# Patient Record
Sex: Female | Born: 1980 | Hispanic: No | Marital: Married | Smoking: Never smoker
Health system: Southern US, Community
[De-identification: ages and names within clinical notes are randomized; demographics above are authoritative.]

## PROBLEM LIST (undated history)

## (undated) ENCOUNTER — Inpatient Hospital Stay (HOSPITAL_COMMUNITY): Payer: Self-pay

---

## 2013-02-20 LAB — OB RESULTS CONSOLE HIV ANTIBODY (ROUTINE TESTING): HIV: NONREACTIVE

## 2013-02-28 LAB — OB RESULTS CONSOLE ABO/RH: RH TYPE: POSITIVE

## 2013-02-28 LAB — OB RESULTS CONSOLE ANTIBODY SCREEN: ANTIBODY SCREEN: NEGATIVE

## 2013-05-24 NOTE — L&D Delivery Note (Signed)
Delivery Note At 7:01 AM a viable female was delivered via Vaginal, Spontaneous Delivery (Presentation: ; Occiput Anterior).  APGAR: 9, 9; weight pending.   Placenta status: Intact, Spontaneous.  Cord: 3 vessels with the following complications: None.  Cord pH: not collected.  Anesthesia: None  Episiotomy: None Lacerations: 1st degree Suture Repair: 3.0 vicryl Est. Blood Loss (mL): 400  Mom to postpartum.  Baby to Couplet care / Skin to Skin.  Routine PP orders Breastfeeding   Aqueelah Cotrell 08/28/2013, 7:48 AM

## 2013-06-25 LAB — OB RESULTS CONSOLE GBS: STREP GROUP B AG: NEGATIVE

## 2013-07-18 ENCOUNTER — Encounter (HOSPITAL_COMMUNITY): Payer: Self-pay | Admitting: *Deleted

## 2013-07-18 ENCOUNTER — Observation Stay (HOSPITAL_COMMUNITY): Payer: BC Managed Care – PPO

## 2013-07-18 ENCOUNTER — Observation Stay (HOSPITAL_COMMUNITY)
Admission: AD | Admit: 2013-07-18 | Discharge: 2013-07-19 | Disposition: A | Discharge status: Home / Self Care | Payer: BC Managed Care – PPO | Source: Ambulatory Visit | Attending: Obstetrics and Gynecology | Admitting: Obstetrics and Gynecology

## 2013-07-18 DIAGNOSIS — O4690 Antepartum hemorrhage, unspecified, unspecified trimester: Principal | ICD-10-CM | POA: Insufficient documentation

## 2013-07-18 DIAGNOSIS — O4693 Antepartum hemorrhage, unspecified, third trimester: Secondary | ICD-10-CM

## 2013-07-18 DIAGNOSIS — O4700 False labor before 37 completed weeks of gestation, unspecified trimester: Secondary | ICD-10-CM | POA: Diagnosis present

## 2013-07-18 DIAGNOSIS — O99019 Anemia complicating pregnancy, unspecified trimester: Secondary | ICD-10-CM | POA: Insufficient documentation

## 2013-07-18 DIAGNOSIS — D649 Anemia, unspecified: Secondary | ICD-10-CM | POA: Insufficient documentation

## 2013-07-18 LAB — CBC
HCT: 29 % — ABNORMAL LOW (ref 36.0–46.0)
Hemoglobin: 9.9 g/dL — ABNORMAL LOW (ref 12.0–15.0)
MCH: 30.6 pg (ref 26.0–34.0)
MCHC: 34.1 g/dL (ref 30.0–36.0)
MCV: 89.5 fL (ref 78.0–100.0)
PLATELETS: 221 10*3/uL (ref 150–400)
RBC: 3.24 MIL/uL — ABNORMAL LOW (ref 3.87–5.11)
RDW: 12.9 % (ref 11.5–15.5)
WBC: 11.3 10*3/uL — ABNORMAL HIGH (ref 4.0–10.5)

## 2013-07-18 LAB — OB RESULTS CONSOLE GBS
GBS: NEGATIVE
STREP GROUP B AG: NEGATIVE
STREP GROUP B AG: NEGATIVE

## 2013-07-18 LAB — AMNISURE RUPTURE OF MEMBRANE (ROM) NOT AT ARMC: AMNISURE: NEGATIVE

## 2013-07-18 IMAGING — US US OB COMP +14 WK
1 series · 12 of 28 positions shown · non-contrast
Comparison: none

[Series 1: us ob comp +14 wk · 53 acquisitions, 12 frames shown]
[im 2/53]
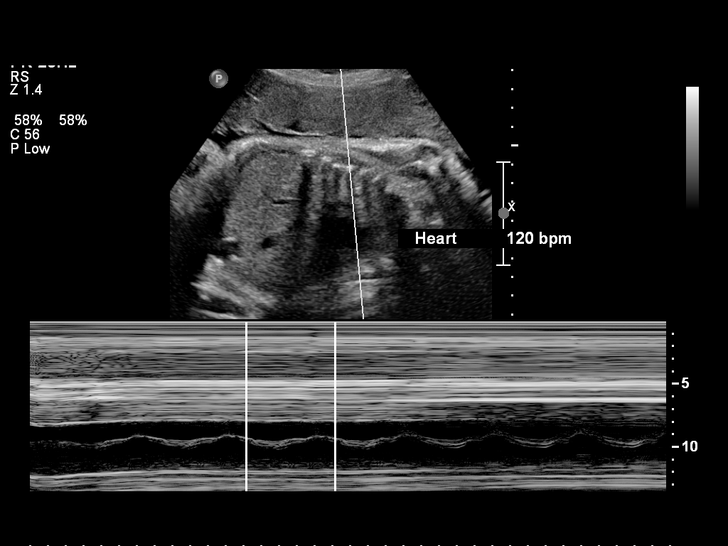
[im 6/53]
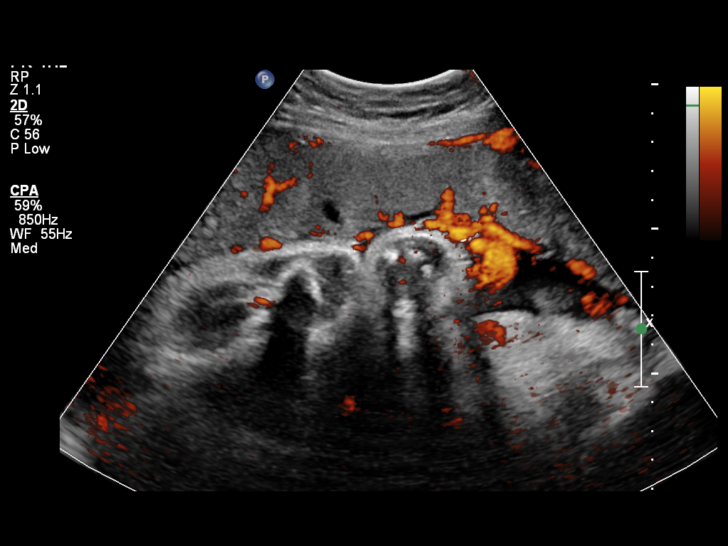
[im 10/53]
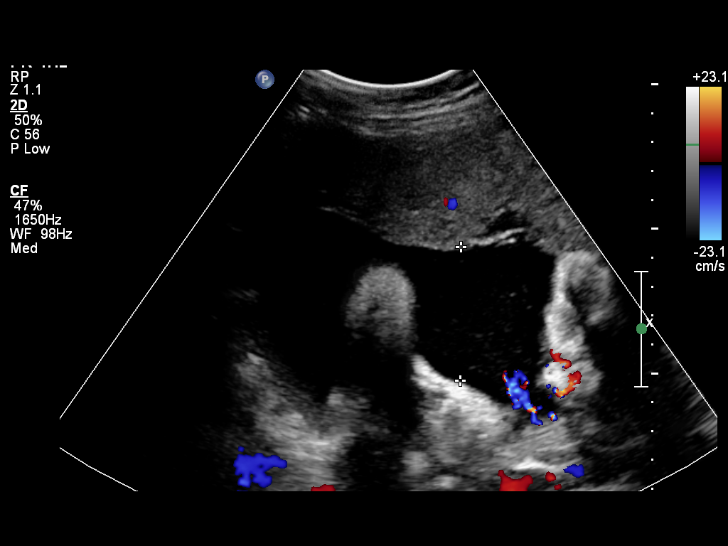
[im 16/53]
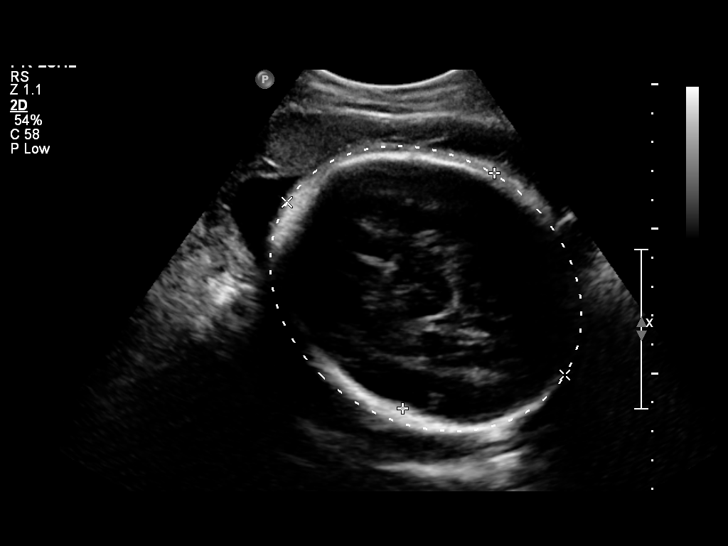
[im 20/53]
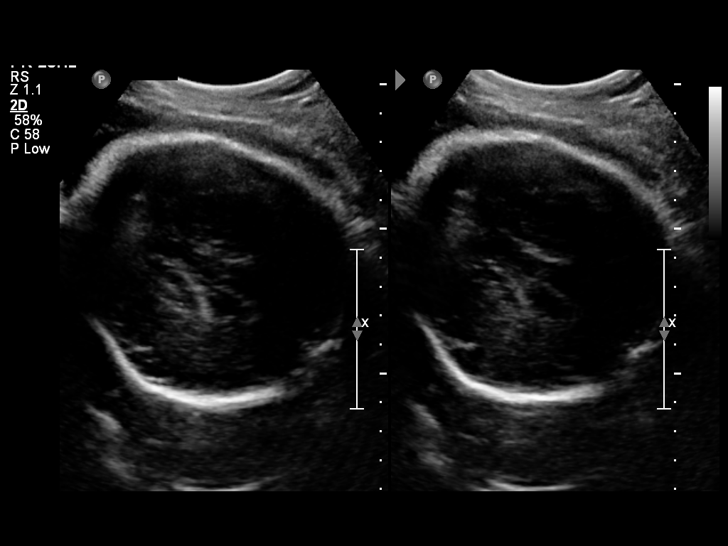
[im 24/53]
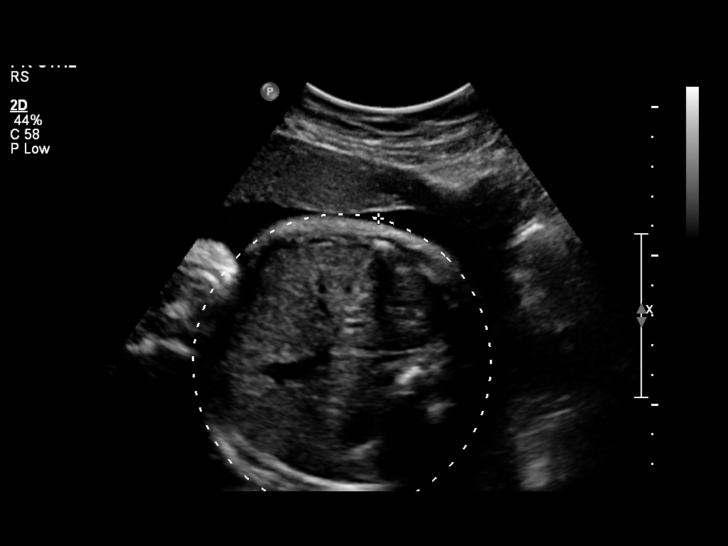
[im 29/53]
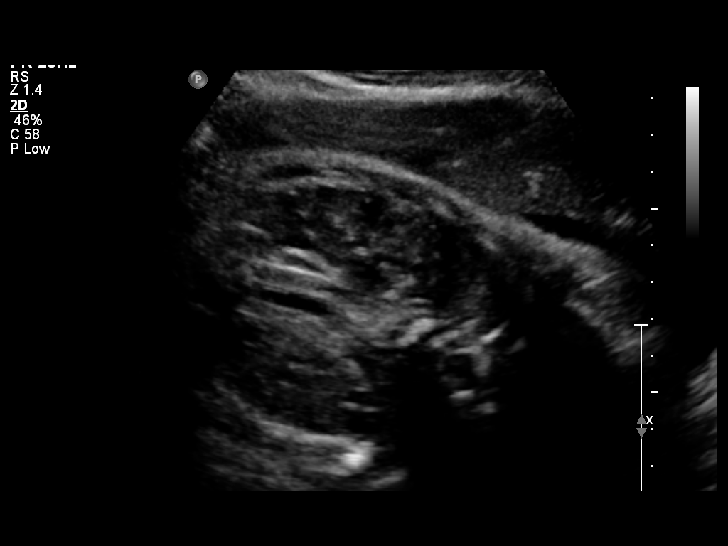
[im 33/53]
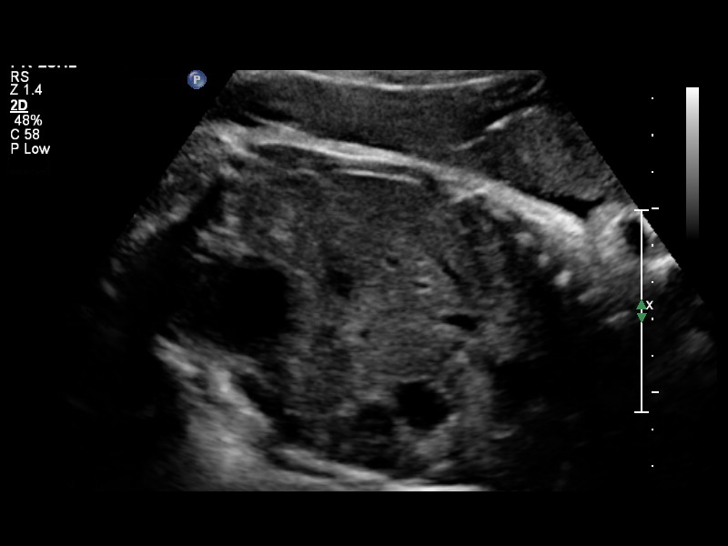
[im 37/53]
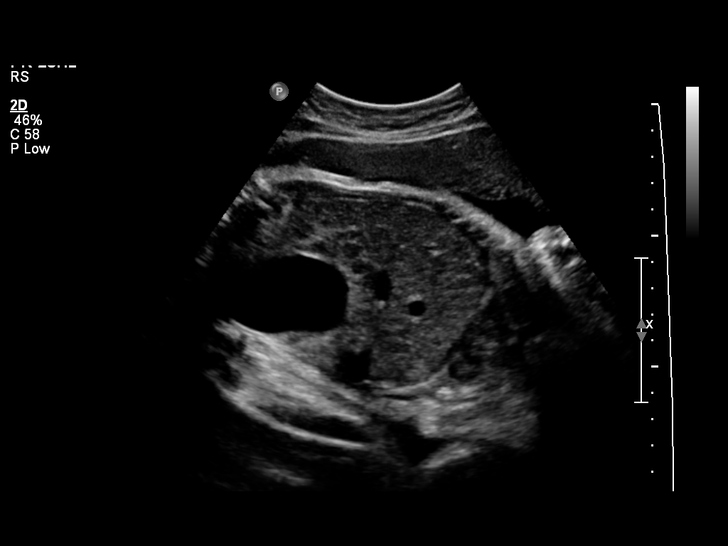
[im 43/53]
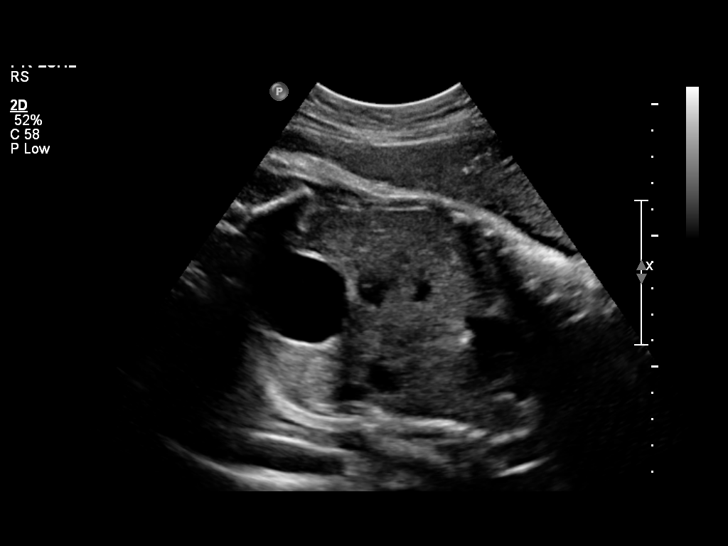
[im 47/53]
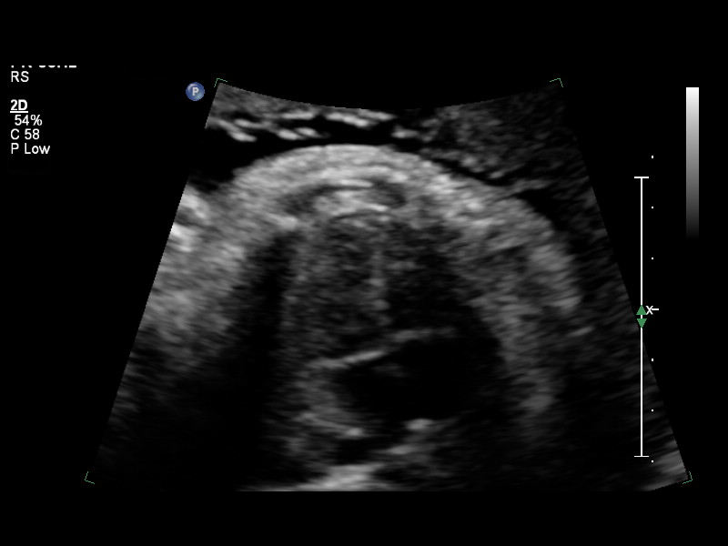
[im 51/53]
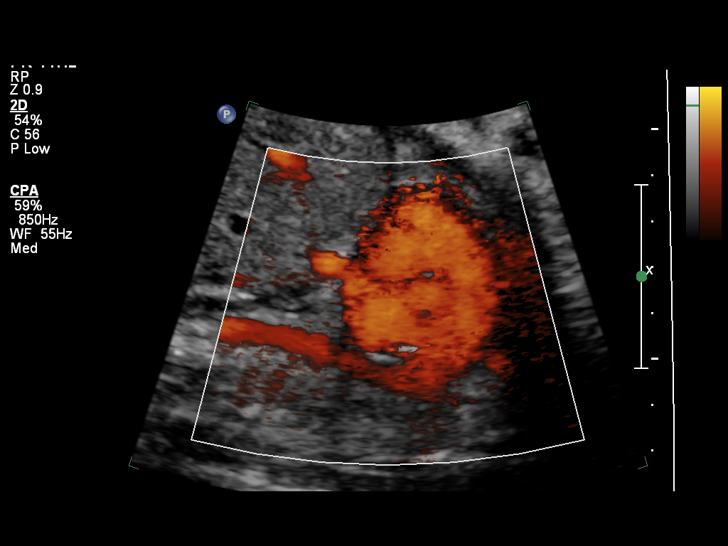

[12 of 28 positions shown; findings below may reference images not displayed]

OBSTETRICS REPORT
                      (Signed Final [DATE] [DATE])

Service(s) Provided

 US OB COMP + 14 WK                                    76805.1
Indications

 Vaginal bleeding, unknown etiology
 Preterm labor (> 22 weeks)
Fetal Evaluation

 Num Of Fetuses:    1
 Fetal Heart Rate:  120                          bpm
 Cardiac Activity:  Observed
 Presentation:      Cephalic
 Placenta:          Anterior, above cervical os
 P. Cord            Visualized, central
 Insertion:

 Amniotic Fluid
 AFI FV:      Subjectively within normal limits
 AFI Sum:     14.73   cm       53  %Tile     Larg Pckt:    4.61  cm
 RUQ:   4.61    cm   RLQ:    4.13   cm    LUQ:   3.17    cm   LLQ:    2.82   cm
Biometry

 BPD:       88  mm     G. Age:  35w 4d                CI:        73.59   70 - 86
                                                      FL/HC:      20.4   20.1 -

 HC:     325.9  mm     G. Age:  37w 0d       68  %    HC/AC:      1.05   0.93 -

 AC:     309.2  mm     G. Age:  34w 6d       56  %    FL/BPD:     75.6   71 - 87
 FL:      66.5  mm     G. Age:  34w 2d       26  %    FL/AC:      21.5   20 - 24

 Est. FW:    [MY]  gm    5 lb 10 oz      64  %
Gestational Age

 U/S Today:     35w 3d                                        EDD:   [DATE]
 Best:          34w 6d     Det. By:  Early Ultrasound         EDD:   [DATE]
Anatomy

 Cranium:          Appears normal         Aortic Arch:      Not well visualized
 Fetal Cavum:      Appears normal         Ductal Arch:      Appears normal
 Ventricles:       Appears normal         Diaphragm:        Appears normal
 Choroid Plexus:   Appears normal         Stomach:          Appears normal, left
                                                            sided
 Cerebellum:       Not well visualized    Abdomen:          Appears normal
 Posterior Fossa:  Not well visualized    Abdominal Wall:   Not well visualized
 Nuchal Fold:      Not applicable (>20    Cord Vessels:     Appears normal (3
                   wks GA)                                  vessel cord)
 Face:             Orbits appear          Kidneys:          Appear normal
                   normal
 Lips:             Appears normal         Bladder:          Appears normal
 Heart:            Appears normal         Spine:            Not well visualized
                   (4CH, axis, and
                   situs)
 RVOT:             Appears normal         Lower             Appears normal
                                          Extremities:
 LVOT:             Appears normal         Upper             Appears normal
                                          Extremities:

 Other:  Fetus appears to be a female. Nasal bone visualized. Technically
         difficult due to fetal position.
Cervix Uterus Adnexa

 Cervix:       Not visualized (advanced GA >[MY])
Impression

 IUP at 34+6 weeks
 Normal detailed fetal anatomy; limited views of profle,
 posterior fossa, AA, CI and spine
 Normal amniotic fluid volume
 Measurements consistent with early US; EFW at the 64th
 %tile
 Anterior placenta without evidence of abruption; no previa
Recommendations

 Follow-up as clinically indicated

 questions or concerns.

## 2013-07-18 MED ORDER — ACETAMINOPHEN 325 MG PO TABS: 650.0000 mg | ORAL_TABLET | ORAL | Status: DC | PRN

## 2013-07-18 MED ORDER — CALCIUM CARBONATE ANTACID 500 MG PO CHEW: 2.0000 | CHEWABLE_TABLET | ORAL | Status: DC | PRN

## 2013-07-18 MED ORDER — PRENATAL MULTIVITAMIN CH
1.0000 | ORAL_TABLET | Freq: Every day | ORAL | Status: DC
  Administered 2013-07-19: 1 via ORAL
  Filled 2013-07-18: qty 1

## 2013-07-18 MED ORDER — ZOLPIDEM TARTRATE 5 MG PO TABS: 5.0000 mg | ORAL_TABLET | Freq: Every evening | ORAL | Status: DC | PRN

## 2013-07-18 MED ORDER — ONDANSETRON HCL 4 MG/2ML IJ SOLN
4.0000 mg | Freq: Once | INTRAMUSCULAR | Status: AC
  Administered 2013-07-18: 4 mg via INTRAVENOUS
  Filled 2013-07-18: qty 2

## 2013-07-18 MED ORDER — NIFEDIPINE 10 MG PO CAPS
10.0000 mg | ORAL_CAPSULE | ORAL | Status: DC | PRN
  Administered 2013-07-18 – 2013-07-19 (×2): 10 mg via ORAL
  Filled 2013-07-18 (×2): qty 1

## 2013-07-18 MED ORDER — DOCUSATE SODIUM 100 MG PO CAPS
100.0000 mg | ORAL_CAPSULE | Freq: Every day | ORAL | Status: DC
  Administered 2013-07-19: 100 mg via ORAL
  Filled 2013-07-18: qty 1

## 2013-07-18 MED ORDER — DEXTROSE IN LACTATED RINGERS 5 % IV SOLN
INTRAVENOUS | Status: DC
  Administered 2013-07-18 – 2013-07-19 (×2): via INTRAVENOUS

## 2013-07-18 NOTE — Progress Notes (Signed)
Dr Stefano GaulStringer notified pt admitted to room 154.  MD will come to evaluate pt

## 2013-07-18 NOTE — H&P (Signed)
Admission History and Physical Exam for an Obstetrics Patient  [name] is a 33 y.o. female, G3P1011, at [redacted]w[redacted]d gestation, who presents for evaluation of third trimester bleeding and uterine contractions. She has been followed at the [facility] and Gynecology division of [facility].  Her pregnancy has been complicated by anemia. The patient presented to the office today complaining of bleeding and contractions. She was evaluated and was found to be 3 cm dilated. Her cervix is said to be friable. See history below.  OB History   Grav Para Term Preterm Abortions TAB SAB Ect Mult Living   3 1 1  1 1    1       History reviewed. No pertinent past medical history.  Prescriptions prior to admission  Medication Sig Dispense Refill  . Prenatal Vit-Fe Fumarate-FA (PRENATAL MULTIVITAMIN) TABS tablet Take 1 tablet by mouth daily at 12 noon.        History reviewed. No pertinent past surgical history.  Allergies  Allergen Reactions  . Codeine Nausea And Vomiting    Family History: family history is not on file.  Social History:  reports that she has never smoked. She has never used smokeless tobacco. She reports that she does not drink alcohol or use illicit drugs.  Review of systems: Normal pregnancy complaints.  Admission Physical Exam:    Body mass index is 28.82 kg/(m^2).  Blood pressure 118/67, pulse 84, temperature 97.9 F (36.6 C), temperature source Oral, resp. rate 18, height 5\' 4"  (1.626 m), weight 168 lb (76.204 kg), last menstrual period 11/28/2012.  HEENT:                 Within normal limits Chest:                   Clear Heart:                    Regular rate and rhythm Abdomen:             Gravid and nontender Extremities:          Grossly normal Neurologic exam: Grossly normal Pelvic exam:         Cervix: 3 cm. Minimal spotting noted at this time  Prenatal labs: ABO, Rh:             O/Positive/-- (10/08 0000) HBsAg:                    nonreactive HIV:                       Non-reactive (09/30 0000)  GBS:                       pending Antibody:              Negative (10/08 0000) Rubella:                  immune RPR:                      nonreactive     Prenatal Transfer Tool  Maternal Diabetes: No Genetic Screening: Declined Maternal Ultrasounds/Referrals: Normal Fetal Ultrasounds or other Referrals:  None Maternal Substance Abuse:  No Significant Maternal Medications:  None Significant Maternal Lab Results:  None Other Comments:  Gonorrhea, Chlamydia, and beta strep cultures are pending  Results for orders placed during the hospital encounter  of 07/18/13 (from the past 24 hour(s))  AMNISURE RUPTURE OF MEMBRANE (ROM)     Status: None   Collection Time    07/18/13  6:40 PM      Result Value Ref Range   Amnisure ROM NEGATIVE    CBC     Status: Abnormal   Collection Time    07/18/13  8:03 PM      Result Value Ref Range   WBC 11.3 (*) 4.0 - 10.5 K/uL   RBC 3.24 (*) 3.87 - 5.11 MIL/uL   Hemoglobin 9.9 (*) 12.0 - 15.0 g/dL   HCT 16.129.0 (*) 09.636.0 - 04.546.0 %   MCV 89.5  78.0 - 100.0 fL   MCH 30.6  26.0 - 34.0 pg   MCHC 34.1  30.0 - 36.0 g/dL   RDW 40.912.9  81.111.5 - 91.415.5 %   Platelets 221  150 - 400 K/uL   Fetal heart tones: Category 1 Contractions: Mild and every 3-4 minutes  Assessment:  5674w6d gestation  Third trimester bleeding  Uterine contractions  Anemia  Rule out labor  Plan:  An ultrasound has been performed and the results are pending.  We will give the patient a fluid bolus and use Procardia as needed. We do not believe that betamethasone is justified.  The patient will be observed at least overnight.  Repeat CBC in the morning.   Katherine Chandler,Katherine Chandler 07/18/2013, 8:24 PM

## 2013-07-18 NOTE — Progress Notes (Signed)
Pt to US per w/c

## 2013-07-19 DIAGNOSIS — O4693 Antepartum hemorrhage, unspecified, third trimester: Secondary | ICD-10-CM

## 2013-07-19 LAB — CBC
HCT: 26.9 % — ABNORMAL LOW (ref 36.0–46.0)
Hemoglobin: 9 g/dL — ABNORMAL LOW (ref 12.0–15.0)
MCH: 30.7 pg (ref 26.0–34.0)
MCHC: 33.5 g/dL (ref 30.0–36.0)
MCV: 91.8 fL (ref 78.0–100.0)
Platelets: 239 10*3/uL (ref 150–400)
RBC: 2.93 MIL/uL — AB (ref 3.87–5.11)
RDW: 12.8 % (ref 11.5–15.5)
WBC: 8.5 10*3/uL (ref 4.0–10.5)

## 2013-07-19 NOTE — Progress Notes (Signed)
Pt. Is stable and ready to be discharged home. All belongings are with the patient. All discharge instructions are given and prescriptions reviewed. Pt. Wheeled out via wheelchair to husband.

## 2013-07-19 NOTE — Discharge Instructions (Signed)
Preterm Labor Information °Preterm labor is when labor starts before you are [redacted] weeks pregnant. The normal length of pregnancy is 39 to 41 weeks.  °CAUSES  °The cause of preterm labor is not often known. The most common known cause is infection. °RISK FACTORS °· Having a history of preterm labor. °· Having your water break before it should. °· Having a placenta that covers the opening of the cervix. °· Having a placenta that breaks away from the uterus. °· Having a cervix that is too weak to hold the baby in the uterus. °· Having too much fluid in the amniotic sac. °· Taking drugs or smoking while pregnant. °· Not gaining enough weight while pregnant. °· Being younger than 18 and older than 31 years old. °· Having a low income. °· Being African American. °SYMPTOMS °· Period-like cramps, belly (abdominal) pain, or back pain. °· Contractions that are regular, as often as six in an hour. They may be mild or painful. °· Contractions that start at the top of the belly. They then move to the lower belly and back. °· Lower belly pressure that seems to get stronger. °· Bleeding from the vagina. °· Fluid leaking from the vagina. °TREATMENT  °Treatment depends on: °· Your condition. °· The condition of your baby. °· How many weeks pregnant you are. °Your doctor may have you: °· Take medicine to stop contractions. °· Stay in bed except to use the restroom (bed rest). °· Stay in the hospital. °WHAT SHOULD YOU DO IF YOU THINK YOU ARE IN PRETERM LABOR? °Call your doctor right away. You need to go to the hospital right away.  °HOW CAN YOU PREVENT PRETERM LABOR IN FUTURE PREGNANCIES? °· Stop smoking, if you smoke. °· Maintain healthy weight gain. °· Do not take drugs or be around chemicals that are not needed. °· Tell your doctor if you think you have an infection. °· Tell your doctor if you had a preterm labor before. °Document Released: 08/06/2008 Document Revised: 02/28/2013 Document Reviewed: 08/06/2008 °ExitCare® Patient  Information ©2014 [employer]. ° °

## 2013-07-19 NOTE — Discharge Summary (Signed)
Physician Discharge Summary  Patient ID: Katherine ChimesStacy Chandler MRN: [id number] DOB/AGE: 1980/10/19 33 y.o.  Admit date: 07/18/2013 Discharge date: 07/19/2013  Admission Diagnoses: Preterm contractions with vaginal bleeding and advanced cervical dilation  Discharge Diagnoses: same the vaginal bleeding has resolved Active Problems:   Premature uterine contractions, antepartum   Third trimester bleeding   Discharged Condition: good  Hospital Course: Pt was admitted for observation secondary to thired trimester bleeding with contractions and dilation to 3 cm.  She had a normal US with normal growth.  She has stopped contracting and no longer has VB  Consults: None  Significant Diagnostic Studies: labs:  Recent Results (from the past 2160 hour(s))  AMNISURE RUPTURE OF MEMBRANE (ROM)     Status: None   Collection Time    07/18/13  6:40 PM      Result Value Ref Range   Amnisure ROM NEGATIVE    CBC     Status: Abnormal   Collection Time    07/18/13  8:03 PM      Result Value Ref Range   WBC 11.3 (*) 4.0 - 10.5 K/uL   RBC 3.24 (*) 3.87 - 5.11 MIL/uL   Hemoglobin 9.9 (*) 12.0 - 15.0 g/dL   HCT 62.129.0 (*) 30.836.0 - 65.746.0 %   MCV 89.5  78.0 - 100.0 fL   MCH 30.6  26.0 - 34.0 pg   MCHC 34.1  30.0 - 36.0 g/dL   RDW 84.612.9  96.211.5 - 95.215.5 %   Platelets 221  150 - 400 K/uL  CBC     Status: Abnormal   Collection Time    07/19/13  5:29 AM      Result Value Ref Range   WBC 8.5  4.0 - 10.5 K/uL   RBC 2.93 (*) 3.87 - 5.11 MIL/uL   Hemoglobin 9.0 (*) 12.0 - 15.0 g/dL   HCT 84.126.9 (*) 32.436.0 - 40.146.0 %   MCV 91.8  78.0 - 100.0 fL   MCH 30.7  26.0 - 34.0 pg   MCHC 33.5  30.0 - 36.0 g/dL   RDW 02.712.8  25.311.5 - 66.415.5 %   Platelets 239  150 - 400 K/uL     Treatments: IV hydration  Discharge Exam: Blood pressure 105/55, pulse 86, temperature 98.6 F (37 C), temperature source Oral, resp. rate 16, height 5\' 4"  (1.626 m), weight 168 lb (76.204 kg), last menstrual period 11/28/2012. General appearance: alert and  cooperative Back: no tenderness to percussion or palpation Resp: clear to auscultation bilaterally Cardio: regular rate and rhythm, S1, S2 normal, no murmur, click, rub or gallop Extremities: extremities normal, atraumatic, no cyanosis or edema BP 106/61  Pulse 79  Temp(Src) 98.5 F (36.9 C) (Oral)  Resp 18  Ht 5\' 4"  (1.626 m)  Wt 168 lb (76.204 kg)  BMI 28.82 kg/m2  LMP 11/28/2012 Cat 1 NST with occ contraction  Disposition: Final discharge disposition not confirmed  Discharge Orders   Future Orders Complete By Expires   Discharge patient  As directed        Medication List         prenatal multivitamin Tabs tablet  Take 1 tablet by mouth daily at 12 noon.           Follow-up Information   [facility]. (keep current appointment)    Specialty:  Obstetrics and Gynecology   Contact information:   [address] [place] [id number] [phone]  SignedJaymes Graff A 07/19/2013, 12:00 PM

## 2013-07-19 NOTE — Progress Notes (Signed)
Physician Discharge Summary  Patient ID: Katherine ChimesStacy Chandler MRN: [id number] DOB/AGE: Mar 14, 1981 33 y.o.  Admit date: 07/18/2013 Discharge date: 07/19/2013  Admission Diagnoses: Uterine contractions, 3rd trimester bleeding, Anemia  Discharge Diagnoses:  Active Problems:   Premature uterine contractions, antepartum   Discharged Condition: good  Hospital Course: Pt admitted for r/o labor.  Two doses of Procardia 10 mg given at 1943 and 0201.  Pt reports not feeling the UCs at 0201.  No UCs noted by pt now.  Bleeding resolved by the time she was admitted.  One dose of Zofran given with N/V now resolved.  Consults: None  Significant Diagnostic Studies: n/a  Treatments: IV hydration  Discharge Exam: Blood pressure 105/58, pulse 80, temperature 98.8 F (37.1 C), temperature source Oral, resp. rate 18, height 5\' 4"  (1.626 m), weight 168 lb (76.204 kg), last menstrual period 11/28/2012. General appearance: alert, cooperative and no distress  Disposition: Final discharge disposition not confirmed     Medication List    ASK your doctor about these medications       prenatal multivitamin Tabs tablet  Take 1 tablet by mouth daily at 12 noon.         SignedHaroldine Laws: Konrad Hoak 07/19/2013, 7:17 AM

## 2013-08-28 ENCOUNTER — Inpatient Hospital Stay (HOSPITAL_COMMUNITY)
Admission: AD | Admit: 2013-08-28 | Discharge: 2013-08-29 | DRG: 775 | Disposition: A | Discharge status: Home / Self Care | Payer: BC Managed Care – PPO | Source: Ambulatory Visit | Attending: Obstetrics and Gynecology | Admitting: Obstetrics and Gynecology

## 2013-08-28 ENCOUNTER — Encounter (HOSPITAL_COMMUNITY): Payer: Self-pay | Admitting: *Deleted

## 2013-08-28 DIAGNOSIS — IMO0001 Reserved for inherently not codable concepts without codable children: Secondary | ICD-10-CM

## 2013-08-28 LAB — CBC
HEMATOCRIT: 29.8 % — AB (ref 36.0–46.0)
Hemoglobin: 9.8 g/dL — ABNORMAL LOW (ref 12.0–15.0)
MCH: 29 pg (ref 26.0–34.0)
MCHC: 32.9 g/dL (ref 30.0–36.0)
MCV: 88.2 fL (ref 78.0–100.0)
Platelets: 275 10*3/uL (ref 150–400)
RBC: 3.38 MIL/uL — ABNORMAL LOW (ref 3.87–5.11)
RDW: 13.6 % (ref 11.5–15.5)
WBC: 15.7 10*3/uL — ABNORMAL HIGH (ref 4.0–10.5)

## 2013-08-28 LAB — OB RESULTS CONSOLE GBS
GBS: POSITIVE
STREP GROUP B AG: NEGATIVE

## 2013-08-28 LAB — OB RESULTS CONSOLE GC/CHLAMYDIA
Chlamydia: NEGATIVE
GC PROBE AMP, GENITAL: NEGATIVE

## 2013-08-28 LAB — OB RESULTS CONSOLE HIV ANTIBODY (ROUTINE TESTING): HIV: NONREACTIVE

## 2013-08-28 LAB — TYPE AND SCREEN
ABO/RH(D): A POS
Antibody Screen: NEGATIVE

## 2013-08-28 LAB — OB RESULTS CONSOLE HEPATITIS B SURFACE ANTIGEN: Hepatitis B Surface Ag: NEGATIVE

## 2013-08-28 LAB — OB RESULTS CONSOLE ABO/RH

## 2013-08-28 LAB — ABO/RH: ABO/RH(D): A POS

## 2013-08-28 LAB — OB RESULTS CONSOLE RUBELLA ANTIBODY, IGM: Rubella: IMMUNE

## 2013-08-28 LAB — OB RESULTS CONSOLE RPR: RPR: NONREACTIVE

## 2013-08-28 LAB — RPR: RPR: NONREACTIVE

## 2013-08-28 MED ORDER — OXYTOCIN 10 UNIT/ML IJ SOLN
INTRAMUSCULAR | Status: AC
  Filled 2013-08-28: qty 2

## 2013-08-28 MED ORDER — ONDANSETRON HCL 4 MG/2ML IJ SOLN: 4.0000 mg | INTRAMUSCULAR | Status: DC | PRN

## 2013-08-28 MED ORDER — ZOLPIDEM TARTRATE 5 MG PO TABS: 5.0000 mg | ORAL_TABLET | Freq: Every evening | ORAL | Status: DC | PRN

## 2013-08-28 MED ORDER — SENNOSIDES-DOCUSATE SODIUM 8.6-50 MG PO TABS
2.0000 | ORAL_TABLET | ORAL | Status: DC
  Administered 2013-08-29: 2 via ORAL
  Filled 2013-08-28: qty 2

## 2013-08-28 MED ORDER — LANOLIN HYDROUS EX OINT: TOPICAL_OINTMENT | CUTANEOUS | Status: DC | PRN

## 2013-08-28 MED ORDER — OXYTOCIN 10 UNIT/ML IJ SOLN
20.0000 [IU] | Freq: Once | INTRAMUSCULAR | Status: AC
  Administered 2013-08-28: 10 [IU] via INTRAMUSCULAR

## 2013-08-28 MED ORDER — OXYTOCIN 40 UNITS IN LACTATED RINGERS INFUSION - SIMPLE MED: 62.5000 mL/h | INTRAVENOUS | Status: DC

## 2013-08-28 MED ORDER — DIBUCAINE 1 % RE OINT: 1.0000 "application " | TOPICAL_OINTMENT | RECTAL | Status: DC | PRN

## 2013-08-28 MED ORDER — FERROUS SULFATE 325 (65 FE) MG PO TABS
325.0000 mg | ORAL_TABLET | Freq: Two times a day (BID) | ORAL | Status: DC
  Administered 2013-08-28 – 2013-08-29 (×2): 325 mg via ORAL
  Filled 2013-08-28 (×2): qty 1

## 2013-08-28 MED ORDER — LACTATED RINGERS IV SOLN: 500.0000 mL | INTRAVENOUS | Status: DC | PRN

## 2013-08-28 MED ORDER — BENZOCAINE-MENTHOL 20-0.5 % EX AERO: 1.0000 "application " | INHALATION_SPRAY | CUTANEOUS | Status: DC | PRN

## 2013-08-28 MED ORDER — ONDANSETRON HCL 4 MG/2ML IJ SOLN: 4.0000 mg | Freq: Four times a day (QID) | INTRAMUSCULAR | Status: DC | PRN

## 2013-08-28 MED ORDER — ACETAMINOPHEN 325 MG PO TABS: 650.0000 mg | ORAL_TABLET | ORAL | Status: DC | PRN

## 2013-08-28 MED ORDER — IBUPROFEN 600 MG PO TABS
600.0000 mg | ORAL_TABLET | Freq: Four times a day (QID) | ORAL | Status: DC | PRN
  Administered 2013-08-28: 600 mg via ORAL
  Filled 2013-08-28: qty 1

## 2013-08-28 MED ORDER — OXYCODONE-ACETAMINOPHEN 5-325 MG PO TABS
1.0000 | ORAL_TABLET | Freq: Four times a day (QID) | ORAL | Status: DC | PRN
  Administered 2013-08-28: 1 via ORAL
  Filled 2013-08-28: qty 1

## 2013-08-28 MED ORDER — SIMETHICONE 80 MG PO CHEW: 80.0000 mg | CHEWABLE_TABLET | ORAL | Status: DC | PRN

## 2013-08-28 MED ORDER — LIDOCAINE HCL (PF) 1 % IJ SOLN
30.0000 mL | INTRAMUSCULAR | Status: DC | PRN
  Administered 2013-08-28: 30 mL via SUBCUTANEOUS
  Filled 2013-08-28: qty 30

## 2013-08-28 MED ORDER — IBUPROFEN 600 MG PO TABS
600.0000 mg | ORAL_TABLET | Freq: Four times a day (QID) | ORAL | Status: DC
  Administered 2013-08-28 – 2013-08-29 (×5): 600 mg via ORAL
  Filled 2013-08-28 (×5): qty 1

## 2013-08-28 MED ORDER — WITCH HAZEL-GLYCERIN EX PADS: 1.0000 "application " | MEDICATED_PAD | CUTANEOUS | Status: DC | PRN

## 2013-08-28 MED ORDER — CITRIC ACID-SODIUM CITRATE 334-500 MG/5ML PO SOLN: 30.0000 mL | ORAL | Status: DC | PRN

## 2013-08-28 MED ORDER — ONDANSETRON HCL 4 MG PO TABS: 4.0000 mg | ORAL_TABLET | ORAL | Status: DC | PRN

## 2013-08-28 MED ORDER — LACTATED RINGERS IV SOLN: INTRAVENOUS | Status: DC

## 2013-08-28 MED ORDER — TETANUS-DIPHTH-ACELL PERTUSSIS 5-2.5-18.5 LF-MCG/0.5 IM SUSP
0.5000 mL | Freq: Once | INTRAMUSCULAR | Status: AC
  Administered 2013-08-29: 0.5 mL via INTRAMUSCULAR
  Filled 2013-08-28: qty 0.5

## 2013-08-28 MED ORDER — LIDOCAINE HCL (PF) 1 % IJ SOLN
INTRAMUSCULAR | Status: AC
  Filled 2013-08-28: qty 30

## 2013-08-28 MED ORDER — DIPHENHYDRAMINE HCL 25 MG PO CAPS: 25.0000 mg | ORAL_CAPSULE | Freq: Four times a day (QID) | ORAL | Status: DC | PRN

## 2013-08-28 MED ORDER — PRENATAL MULTIVITAMIN CH
1.0000 | ORAL_TABLET | Freq: Every day | ORAL | Status: DC
  Administered 2013-08-28 – 2013-08-29 (×2): 1 via ORAL
  Filled 2013-08-28 (×2): qty 1

## 2013-08-28 MED ORDER — SODIUM CHLORIDE 0.9 % IV SOLN
2.0000 g | Freq: Once | INTRAVENOUS | Status: DC
  Filled 2013-08-28: qty 2000

## 2013-08-28 MED ORDER — OXYTOCIN BOLUS FROM INFUSION: 500.0000 mL | INTRAVENOUS | Status: DC

## 2013-08-28 NOTE — Lactation Note (Signed)
This note was copied from the chart of Katherine Belva ChimesStacy Kofoed. Lactation Consultation Note  Patient Name: Katherine Chandler WUJWJ'XToday's Date: 08/28/2013 Reason for consult: Initial assessment of this baby and mom at 14 hours post-delivery.  Mom experienced with breastfeeding, states she nursed first chld for 11 months without problems  Initial LATCH score for this new baby=10.  Mom reports that baby has been somewhat spitty so LC suggested trying football position but that some sleepiness is normal for first 24 hours. Baby is LGA but blood sugars have been normal.  LC encouraged STS and cue feedings.  Mom states she knows how to hand express.  LC encouraged review of Baby and Me pp 9, 14 and 20-25 for STS and BF information. LC provided [facility] Resource brochure and reviewed [facility] services and list of community and web site resources.    Maternal Data Formula Feeding for Exclusion: No Infant to breast within first hour of birth: No Breastfeeding delayed due to:: Maternal status (repair in progress but LATCH score=10 when offered breast) Has patient been taught Hand Expression?: Yes (mom states she knows how to express by hand) Does the patient have breastfeeding experience prior to this delivery?: Yes  Feeding    LATCH Score/Interventions            Initial LATCH score=10 after delivery          Lactation Tools Discussed/Used   STS, hand expression, cue feeding  Consult Status Consult Status: Follow-up Date: 08/29/13 Follow-up type: In-patient    Warrick ParisianBryant, Uzma Hellmer [facility] 08/28/2013, 9:08 PM

## 2013-08-28 NOTE — MAU Note (Signed)
Pt came in for contractions and bloody show, midwife at bedside.  Taken down to labor and delivery.

## 2013-08-28 NOTE — H&P (Signed)
Katherine ChimesStacy Chandler is a 33 y.o. female, [id number] at 1246w5d, presenting for active labor.  Patient Active Problem List   Diagnosis Date Noted  . Active labor 08/28/2013  . Third trimester bleeding 07/19/2013  . Premature uterine contractions, antepartum 07/18/2013    History of present pregnancy: Patient entered care at 13 weeks.   EDC of 08/23/12 was established by 15 week u/s.   Anatomy scan:  19 weeks, with normal findings and an anterior placenta.   Additional US evaluations:  34 weeks for bleeding and PTL - SIUP, normal AFI, EFW 64th%ile.   Significant prenatal events:  [facility]   Last evaluation:  08/23/13 at 9770w0d  4cm / 80% / -2  OB History   Grav Para Term Preterm Abortions TAB SAB Ect Mult Living   3 2 2  1 1    2      No past medical history on file. No past surgical history on file. Family History: family history is not on file. Social History:  reports that she has never smoked. She has never used smokeless tobacco. She reports that she does not drink alcohol or use illicit drugs.   Prenatal Transfer Tool  Maternal Diabetes: No Genetic Screening: Declined Maternal Ultrasounds/Referrals: Normal Fetal Ultrasounds or other Referrals:  None Maternal Substance Abuse:  No Significant Maternal Medications:  None Significant Maternal Lab Results: Lab values include: Group B Strep negative    ROS: see HPI above, all other systems are negative   Allergies  Allergen Reactions  . Codeine Nausea And Vomiting     Dilation: 10 Exam by:: J. Archimedes Harold, CNM Blood pressure 111/72, pulse 89, temperature 98.3 F (36.8 C), temperature source Oral, resp. rate 18, height 5\' 4"  (1.626 m), weight 170 lb (77.111 kg), last menstrual period 11/28/2012, unknown if currently breastfeeding.  Chest clear Heart RRR without murmur Abd gravid, NT Ext: WNL  FHR: Very little tracing prior to delivery - reassuring UCs:  Q 2 min  Prenatal labs: ABO, Rh: O/Positive/--  (10/08 0000) Antibody: Negative (10/08 0000) Rubella:   Immune RPR:   Neg HBsAg:   Neg HIV: Non-reactive (09/30 0000)  GBS: Positive (04/07 0000) - Neg JO CNM Sickle cell/Hgb electrophoresis:  n/a Pap:  03/07/14 WNL GC:  Neg Chlamydia:  Neg Genetic screenings:  Declined Glucola:  86 Other:  none    Assessment IUP at 6046w5d Active labor - complete GBS neg  Admit to BS for eminent delivery Routine L&D orders   Rowan BlaseXLEY, JENNIFERCNM, MSN 08/28/2013, 7:51 AM

## 2013-08-29 LAB — CBC
HCT: 27.7 % — ABNORMAL LOW (ref 36.0–46.0)
Hemoglobin: 9.1 g/dL — ABNORMAL LOW (ref 12.0–15.0)
MCH: 28.9 pg (ref 26.0–34.0)
MCHC: 32.9 g/dL (ref 30.0–36.0)
MCV: 87.9 fL (ref 78.0–100.0)
PLATELETS: 242 10*3/uL (ref 150–400)
RBC: 3.15 MIL/uL — ABNORMAL LOW (ref 3.87–5.11)
RDW: 13.8 % (ref 11.5–15.5)
WBC: 14 10*3/uL — AB (ref 4.0–10.5)

## 2013-08-29 MED ORDER — DOCUSATE SODIUM 100 MG PO CAPS: 100.0000 mg | ORAL_CAPSULE | Freq: Two times a day (BID) | ORAL | Status: AC

## 2013-08-29 MED ORDER — FERROUS SULFATE 325 (65 FE) MG PO TABS: 325.0000 mg | ORAL_TABLET | Freq: Two times a day (BID) | ORAL | Status: AC

## 2013-08-29 NOTE — Discharge Summary (Signed)
Vaginal Delivery Discharge Summary  Katherine Chandler  DOB:    12-29-1980 MRN:    [id number] CSN:    [id number]  Date of admission:                  08/28/2013   Date of discharge:                   08/29/2013  Procedures this admission: SVD with repair of 2nd degree perineal laceration.  Date of Delivery: 08/28/2013  Newborn Data:  Live born female  Birth Weight: 9 lb 1.3 oz (4120 g) APGAR: 9, 9  Home with mother. Name: Katherine Chandler Circumcision Plan: N/A  History of Present Illness:  [name] is a 33 y.o. female, [id number], who presents at 4847w5d weeks gestation. The patient has been followed at the [facility]. She was admitted onset of labor. Her pregnancy has been complicated by: none  Hospital course:  The patient was admitted for active labor.   Her labor was not complicated. She proceeded to have a vaginal delivery of a healthy infant. Her delivery was not complicated. Her postpartum course was not complicated.  She was discharged to home on postpartum day 1 doing well.  Feeding:  breast  Contraception:  Nexplanon  Discharge hemoglobin:  Hemoglobin  Date Value Ref Range Status  08/29/2013 9.1* 12.0 - 15.0 g/dL Final     HCT  Date Value Ref Range Status  08/29/2013 27.7* 36.0 - 46.0 % Final    Discharge Physical Exam:   General: alert, cooperative and no distress Lochia: appropriate Uterine Fundus: firm Incision: N/a DVT Evaluation: No evidence of DVT seen on physical exam. Negative Homan's sign.  Intrapartum Procedures: spontaneous vaginal delivery Postpartum Procedures: none Complications-Operative and Postpartum: 2nd degree perineal laceration  Discharge Diagnoses: Term Pregnancy-delivered  Discharge Information:  Activity:           pelvic rest Diet:                routine Medications: Colace and Iron Condition:      stable Instructions:   Postpartum Care After  Vaginal Delivery  After you deliver your newborn (postpartum period), the usual stay in the hospital is 24 72 hours. If there were problems with your labor or delivery, or if you have other medical problems, you might be in the hospital longer.  While you are in the hospital, you will receive help and instructions on how to care for yourself and your newborn during the postpartum period.  While you are in the hospital:  Be sure to tell your nurses if you have pain or discomfort, as well as where you feel the pain and what makes the pain worse.  If you had an incision made near your vagina (episiotomy) or if you had some tearing during delivery, the nurses may put ice packs on your episiotomy or tear. The ice packs may help to reduce the pain and swelling.  If you are breastfeeding, you may feel uncomfortable contractions of your uterus for a couple of weeks. This is normal. The contractions help your uterus get back to normal size.  It is normal to have some bleeding after delivery.  For the first 1 3 days after delivery, the flow is red and the amount may be similar to a period.  It is common for the flow to start and stop.  In the first few days, you may pass some small  clots. Let your nurses know if you begin to pass large clots or your flow increases.  Do not  flush blood clots down the toilet before having the nurse look at them.  During the next 3 10 days after delivery, your flow should become more watery and pink or brown-tinged in color.  Ten to fourteen days after delivery, your flow should be a small amount of yellowish-white discharge.  The amount of your flow will decrease over the first few weeks after delivery. Your flow may stop in 6 8 weeks. Most women have had their flow stop by 12 weeks after delivery.  You should change your sanitary pads frequently.  Wash your hands thoroughly with soap and water for at least 20 seconds after changing pads, using the toilet, or  before holding or feeding your newborn.  You should feel like you need to empty your bladder within the first 6 8 hours after delivery.  In case you become weak, lightheaded, or faint, call your nurse before you get out of bed for the first time and before you take a shower for the first time.  Within the first few days after delivery, your breasts may begin to feel tender and full. This is called engorgement. Breast tenderness usually goes away within 48 72 hours after engorgement occurs. You may also notice milk leaking from your breasts. If you are not breastfeeding, do not stimulate your breasts. Breast stimulation can make your breasts produce more milk.  Spending as much time as possible with your newborn is very important. During this time, you and your newborn can feel close and get to know each other. Having your newborn stay in your room (rooming in) will help to strengthen the bond with your newborn. It will give you time to get to know your newborn and become comfortable caring for your newborn.  Your hormones change after delivery. Sometimes the hormone changes can temporarily cause you to feel sad or tearful. These feelings should not last more than a few days. If these feelings last longer than that, you should talk to your caregiver.  If desired, talk to your caregiver about methods of family planning or contraception.  Talk to your caregiver about immunizations. Your caregiver may want you to have the following immunizations before leaving the hospital:  Tetanus, diphtheria, and pertussis (Tdap) or tetanus and diphtheria (Td) immunization. It is very important that you and your family (including grandparents) or others caring for your newborn are up-to-date with the Tdap or Td immunizations. The Tdap or Td immunization can help protect your newborn from getting ill.  Rubella immunization.  Varicella (chickenpox) immunization.  Influenza immunization. You should receive this  annual immunization if you did not receive the immunization during your pregnancy. Document Released: 03/07/2007 Document Revised: 02/02/2012 Document Reviewed: 01/05/2012 [facility] Patient Information 2014 [place], [place].   Postpartum Depression and Baby Blues  The postpartum period begins right after the birth of a baby. During this time, there is often a great amount of joy and excitement. It is also a time of considerable changes in the life of the parent(s). Regardless of how many times a mother gives birth, each child brings new challenges and dynamics to the family. It is not unusual to have feelings of excitement accompanied by confusing shifts in moods, emotions, and thoughts. All mothers are at risk of developing postpartum depression or the "baby blues." These mood changes can occur right after giving birth, or they may occur many months after  giving birth. The baby blues or postpartum depression can be mild or severe. Additionally, postpartum depression can resolve rather quickly, or it can be a long-term condition. CAUSES Elevated hormones and their rapid decline are thought to be a main cause of postpartum depression and the baby blues. There are a number of hormones that radically change during and after pregnancy. Estrogen and progesterone usually decrease immediately after delivering your baby. The level of thyroid hormone and various cortisol steroids also rapidly drop. Other factors that play a major role in these changes include major life events and genetics.  RISK FACTORS If you have any of the following risks for the baby blues or postpartum depression, know what symptoms to watch out for during the postpartum period. Risk factors that may increase the likelihood of getting the baby blues or postpartum depression include:  Havinga personal or family history of depression.  Having depression while being pregnant.  Having premenstrual or oral contraceptive-associated mood  issues.  Having exceptional life stress.  Having marital conflict.  Lacking a social support network.  Having a baby with special needs.  Having health problems such as diabetes. SYMPTOMS Baby blues symptoms include:  Brief fluctuations in mood, such as going from extreme happiness to sadness.  Decreased concentration.  Difficulty sleeping.  Crying spells, tearfulness.  Irritability.  Anxiety. Postpartum depression symptoms typically begin within the first month after giving birth. These symptoms include:  Difficulty sleeping or excessive sleepiness.  Marked weight loss.  Agitation.  Feelings of worthlessness.  Lack of interest in activity or food. Postpartum psychosis is a very concerning condition and can be dangerous. Fortunately, it is rare. Displaying any of the following symptoms is cause for immediate medical attention. Postpartum psychosis symptoms include:  Hallucinations and delusions.  Bizarre or disorganized behavior.  Confusion or disorientation. DIAGNOSIS  A diagnosis is made by an evaluation of your symptoms. There are no medical or lab tests that lead to a diagnosis, but there are various questionnaires that a caregiver may use to identify those with the baby blues, postpartum depression, or psychosis. Often times, a screening tool called the [place] Postnatal Depression Scale is used to diagnose depression in the postpartum period.  TREATMENT The baby blues usually goes away on its own in 1 to 2 weeks. Social support is often all that is needed. You should be encouraged to get adequate sleep and rest. Occasionally, you may be given medicines to help you sleep.  Postpartum depression requires treatment as it can last several months or longer if it is not treated. Treatment may include individual or group therapy, medicine, or both to address any social, physiological, and psychological factors that may play a role in the depression. Regular exercise, a  healthy diet, rest, and social support may also be strongly recommended.  Postpartum psychosis is more serious and needs treatment right away. Hospitalization is often needed. HOME CARE INSTRUCTIONS  Get as much rest as you can. Nap when the baby sleeps.  Exercise regularly. Some women find yoga and walking to be beneficial.  Eat a balanced and nourishing diet.  Do little things that you enjoy. Have a cup of tea, take a bubble bath, read your favorite magazine, or listen to your favorite music.  Avoid alcohol.  Ask for help with household chores, cooking, grocery shopping, or running errands as needed. Do not try to do everything.  Talk to people close to you about how you are feeling. Get support from your partner, family members, friends, or  other new moms.  Try to stay positive in how you think. Think about the things you are grateful for.  Do not spend a lot of time alone.  Only take medicine as directed by your caregiver.  Keep all your postpartum appointments.  Let your caregiver know if you have any concerns. SEEK MEDICAL CARE IF: You are having a reaction or problems with your medicine. SEEK IMMEDIATE MEDICAL CARE IF:  You have suicidal feelings.  You feel you may harm the baby or someone else. Document Released: 02/12/2004 Document Revised: 08/02/2011 Document Reviewed: 03/16/2011 [facility] Patient Information 2014 [place], [place].   Discharge to: home  Follow-up Information   Follow up with [facility] In 5 weeks. (Please call if you have any questions or concerns prior to your next visit. )    Specialty:  Obstetrics and Gynecology   Contact information:   [address] [place] [address] [phone]       Marlene Bast, [place], MSN 08/29/2013

## 2013-08-29 NOTE — Discharge Instructions (Signed)
Etonogestrel implant What is this medicine? ETONOGESTREL (et oh noe JES trel) is a contraceptive (birth control) device. It is used to prevent pregnancy. It can be used for up to 3 years. This medicine may be used for other purposes; ask your health care provider or pharmacist if you have questions. COMMON BRAND NAME(S): Implanon, Nexplanon  What should I tell my health care provider before I take this medicine? They need to know if you have any of these conditions: -abnormal vaginal bleeding -blood vessel disease or blood clots -cancer of the breast, cervix, or liver -depression -diabetes -gallbladder disease -headaches -heart disease or recent heart attack -high blood pressure -high cholesterol -kidney disease -liver disease -renal disease -seizures -tobacco smoker -an unusual or allergic reaction to etonogestrel, other hormones, anesthetics or antiseptics, medicines, foods, dyes, or preservatives -pregnant or trying to get pregnant -breast-feeding How should I use this medicine? This device is inserted just under the skin on the inner side of your upper arm by a health care professional. Talk to your pediatrician regarding the use of this medicine in children. Special care may be needed. Overdosage: If you think you've taken too much of this medicine contact a poison control center or emergency room at once. Overdosage: If you think you have taken too much of this medicine contact a poison control center or emergency room at once. NOTE: This medicine is only for you. Do not share this medicine with others. What if I miss a dose? This does not apply. What may interact with this medicine? Do not take this medicine with any of the following medications: -amprenavir -bosentan -fosamprenavir This medicine may also interact with the following medications: -barbiturate medicines for inducing sleep or treating seizures -certain medicines for fungal infections like ketoconazole and  itraconazole -griseofulvin -medicines to treat seizures like carbamazepine, felbamate, oxcarbazepine, phenytoin, topiramate -modafinil -phenylbutazone -rifampin -some medicines to treat HIV infection like atazanavir, indinavir, lopinavir, nelfinavir, tipranavir, ritonavir -St. John's wort This list may not describe all possible interactions. Give your health care provider a list of all the medicines, herbs, non-prescription drugs, or dietary supplements you use. Also tell them if you smoke, drink alcohol, or use illegal drugs. Some items may interact with your medicine. What should I watch for while using this medicine? This product does not protect you against HIV infection (AIDS) or other sexually transmitted diseases. You should be able to feel the implant by pressing your fingertips over the skin where it was inserted. Tell your doctor if you cannot feel the implant. What side effects may I notice from receiving this medicine? Side effects that you should report to your doctor or health care professional as soon as possible: -allergic reactions like skin rash, itching or hives, swelling of the face, lips, or tongue -breast lumps -changes in vision -confusion, trouble speaking or understanding -dark urine -depressed mood -general ill feeling or flu-like symptoms -light-colored stools -loss of appetite, nausea -right upper belly pain -severe headaches -severe pain, swelling, or tenderness in the abdomen -shortness of breath, chest pain, swelling in a leg -signs of pregnancy -sudden numbness or weakness of the face, arm or leg -trouble walking, dizziness, loss of balance or coordination -unusual vaginal bleeding, discharge -unusually weak or tired -yellowing of the eyes or skin Side effects that usually do not require medical attention (Report these to your doctor or health care professional if they continue or are bothersome.): -acne -breast pain -changes in  weight -cough -fever or chills -headache -irregular menstrual bleeding -itching, burning,  and vaginal discharge -pain or difficulty passing urine -sore throat This list may not describe all possible side effects. Call your doctor for medical advice about side effects. You may report side effects to FDA at 1-800-FDA-1088. Where should I keep my medicine? This drug is given in a hospital or clinic and will not be stored at home. NOTE: This sheet is a summary. It may not cover all possible information. If you have questions about this medicine, talk to your doctor, pharmacist, or health care provider.  2014, [employer]. (2011-11-15 15:37:45) Postpartum Depression and Baby Blues The postpartum period begins right after the birth of a baby. During this time, there is often a great amount of joy and excitement. It is also a time of considerable changes in the life of the parent(s). Regardless of how many times a mother gives birth, each child brings new challenges and dynamics to the family. It is not unusual to have feelings of excitement accompanied by confusing shifts in moods, emotions, and thoughts. All mothers are at risk of developing postpartum depression or the "baby blues." These mood changes can occur right after giving birth, or they may occur many months after giving birth. The baby blues or postpartum depression can be mild or severe. Additionally, postpartum depression can resolve rather quickly, or it can be a long-term condition. CAUSES Elevated hormones and their rapid decline are thought to be a main cause of postpartum depression and the baby blues. There are a number of hormones that radically change during and after pregnancy. Estrogen and progesterone usually decrease immediately after delivering your baby. The level of thyroid hormone and various cortisol steroids also rapidly drop. Other factors that play a major role in these changes include major life events and  genetics.  RISK FACTORS If you have any of the following risks for the baby blues or postpartum depression, know what symptoms to watch out for during the postpartum period. Risk factors that may increase the likelihood of getting the baby blues or postpartum depression include:  Havinga personal or family history of depression.  Having depression while being pregnant.  Having premenstrual or oral contraceptive-associated mood issues.  Having exceptional life stress.  Having marital conflict.  Lacking a social support network.  Having a baby with special needs.  Having health problems such as diabetes. SYMPTOMS Baby blues symptoms include:  Brief fluctuations in mood, such as going from extreme happiness to sadness.  Decreased concentration.  Difficulty sleeping.  Crying spells, tearfulness.  Irritability.  Anxiety. Postpartum depression symptoms typically begin within the first month after giving birth. These symptoms include:  Difficulty sleeping or excessive sleepiness.  Marked weight loss.  Agitation.  Feelings of worthlessness.  Lack of interest in activity or food. Postpartum psychosis is a very concerning condition and can be dangerous. Fortunately, it is rare. Displaying any of the following symptoms is cause for immediate medical attention. Postpartum psychosis symptoms include:  Hallucinations and delusions.  Bizarre or disorganized behavior.  Confusion or disorientation. DIAGNOSIS  A diagnosis is made by an evaluation of your symptoms. There are no medical or lab tests that lead to a diagnosis, but there are various questionnaires that a caregiver may use to identify those with the baby blues, postpartum depression, or psychosis. Often times, a screening tool called the [place] Postnatal Depression Scale is used to diagnose depression in the postpartum period.  TREATMENT The baby blues usually goes away on its own in 1 to 2 weeks. Social support is  often  all that is needed. You should be encouraged to get adequate sleep and rest. Occasionally, you may be given medicines to help you sleep.  Postpartum depression requires treatment as it can last several months or longer if it is not treated. Treatment may include individual or group therapy, medicine, or both to address any social, physiological, and psychological factors that may play a role in the depression. Regular exercise, a healthy diet, rest, and social support may also be strongly recommended.  Postpartum psychosis is more serious and needs treatment right away. Hospitalization is often needed. HOME CARE INSTRUCTIONS  Get as much rest as you can. Nap when the baby sleeps.  Exercise regularly. Some women find yoga and walking to be beneficial.  Eat a balanced and nourishing diet.  Do little things that you enjoy. Have a cup of tea, take a bubble bath, read your favorite magazine, or listen to your favorite music.  Avoid alcohol.  Ask for help with household chores, cooking, grocery shopping, or running errands as needed. Do not try to do everything.  Talk to people close to you about how you are feeling. Get support from your partner, family members, friends, or other new moms.  Try to stay positive in how you think. Think about the things you are grateful for.  Do not spend a lot of time alone.  Only take medicine as directed by your caregiver.  Keep all your postpartum appointments.  Let your caregiver know if you have any concerns. SEEK MEDICAL CARE IF: You are having a reaction or problems with your medicine. SEEK IMMEDIATE MEDICAL CARE IF:  You have suicidal feelings.  You feel you may harm the baby or someone else. Document Released: 02/12/2004 Document Revised: 08/02/2011 Document Reviewed: 02/19/2013 [facility] Patient Information 2014 [employer], [place]. Postpartum Care After Vaginal Delivery After you deliver your newborn (postpartum period), the usual stay in  the hospital is 24 72 hours. If there were problems with your labor or delivery, or if you have other medical problems, you might be in the hospital longer.  While you are in the hospital, you will receive help and instructions on how to care for yourself and your newborn during the postpartum period.  While you are in the hospital:  Be sure to tell your nurses if you have pain or discomfort, as well as where you feel the pain and what makes the pain worse.  If you had an incision made near your vagina (episiotomy) or if you had some tearing during delivery, the nurses may put ice packs on your episiotomy or tear. The ice packs may help to reduce the pain and swelling.  If you are breastfeeding, you may feel uncomfortable contractions of your uterus for a couple of weeks. This is normal. The contractions help your uterus get back to normal size.  It is normal to have some bleeding after delivery.  For the first 1 3 days after delivery, the flow is red and the amount may be similar to a period.  It is common for the flow to start and stop.  In the first few days, you may pass some small clots. Let your nurses know if you begin to pass large clots or your flow increases.  Do not  flush blood clots down the toilet before having the nurse look at them.  During the next 3 10 days after delivery, your flow should become more watery and pink or brown-tinged in color.  Ten to fourteen days after delivery,  your flow should be a small amount of yellowish-white discharge.  The amount of your flow will decrease over the first few weeks after delivery. Your flow may stop in 6 8 weeks. Most women have had their flow stop by 12 weeks after delivery.  You should change your sanitary pads frequently.  Wash your hands thoroughly with soap and water for at least 20 seconds after changing pads, using the toilet, or before holding or feeding your newborn.  You should feel like you need to empty your bladder  within the first 6 8 hours after delivery.  In case you become weak, lightheaded, or faint, call your nurse before you get out of bed for the first time and before you take a shower for the first time.  Within the first few days after delivery, your breasts may begin to feel tender and full. This is called engorgement. Breast tenderness usually goes away within 48 72 hours after engorgement occurs. You may also notice milk leaking from your breasts. If you are not breastfeeding, do not stimulate your breasts. Breast stimulation can make your breasts produce more milk.  Spending as much time as possible with your newborn is very important. During this time, you and your newborn can feel close and get to know each other. Having your newborn stay in your room (rooming in) will help to strengthen the bond with your newborn. It will give you time to get to know your newborn and become comfortable caring for your newborn.  Your hormones change after delivery. Sometimes the hormone changes can temporarily cause you to feel sad or tearful. These feelings should not last more than a few days. If these feelings last longer than that, you should talk to your caregiver.  If desired, talk to your caregiver about methods of family planning or contraception.  Talk to your caregiver about immunizations. Your caregiver may want you to have the following immunizations before leaving the hospital:  Tetanus, diphtheria, and pertussis (Tdap) or tetanus and diphtheria (Td) immunization. It is very important that you and your family (including grandparents) or others caring for your newborn are up-to-date with the Tdap or Td immunizations. The Tdap or Td immunization can help protect your newborn from getting ill.  Rubella immunization.  Varicella (chickenpox) immunization.  Influenza immunization. You should receive this annual immunization if you did not receive the immunization during your pregnancy. Document  Released: 03/07/2007 Document Revised: 02/02/2012 Document Reviewed: 01/05/2012 [facility] Patient Information 2014 [place], [place].

## 2014-03-25 ENCOUNTER — Encounter (HOSPITAL_COMMUNITY): Payer: Self-pay | Admitting: *Deleted
